# Patient Record
Sex: Male | Born: 1955 | Race: White | Hispanic: No | Marital: Married | Smoking: Never smoker
Health system: Southern US, Community
[De-identification: ages and names within clinical notes are randomized; demographics above are authoritative.]

## PROBLEM LIST (undated history)

## (undated) DIAGNOSIS — K7030 Alcoholic cirrhosis of liver without ascites: Secondary | ICD-10-CM

## (undated) DIAGNOSIS — I8500 Esophageal varices without bleeding: Secondary | ICD-10-CM

## (undated) DIAGNOSIS — L03115 Cellulitis of right lower limb: Secondary | ICD-10-CM

## (undated) DIAGNOSIS — G4700 Insomnia, unspecified: Secondary | ICD-10-CM

## (undated) DIAGNOSIS — J449 Chronic obstructive pulmonary disease, unspecified: Secondary | ICD-10-CM

## (undated) DIAGNOSIS — K7290 Hepatic failure, unspecified without coma: Secondary | ICD-10-CM

## (undated) DIAGNOSIS — R601 Generalized edema: Secondary | ICD-10-CM

## (undated) DIAGNOSIS — M7531 Calcific tendinitis of right shoulder: Secondary | ICD-10-CM

## (undated) DIAGNOSIS — E559 Vitamin D deficiency, unspecified: Secondary | ICD-10-CM

## (undated) DIAGNOSIS — R52 Pain, unspecified: Secondary | ICD-10-CM

## (undated) DIAGNOSIS — I10 Essential (primary) hypertension: Secondary | ICD-10-CM

## (undated) DIAGNOSIS — I890 Lymphedema, not elsewhere classified: Secondary | ICD-10-CM

## (undated) DIAGNOSIS — F329 Major depressive disorder, single episode, unspecified: Secondary | ICD-10-CM

## (undated) DIAGNOSIS — K219 Gastro-esophageal reflux disease without esophagitis: Secondary | ICD-10-CM

## (undated) DIAGNOSIS — B399 Histoplasmosis, unspecified: Secondary | ICD-10-CM

## (undated) DIAGNOSIS — E669 Obesity, unspecified: Secondary | ICD-10-CM

## (undated) HISTORY — PX: TIPS PROCEDURE: SHX808

## (undated) HISTORY — PX: CHOLECYSTECTOMY: SHX55

---

## 2015-04-13 DIAGNOSIS — R601 Generalized edema: Secondary | ICD-10-CM

## 2015-04-13 DIAGNOSIS — G4700 Insomnia, unspecified: Secondary | ICD-10-CM

## 2015-04-13 DIAGNOSIS — K219 Gastro-esophageal reflux disease without esophagitis: Secondary | ICD-10-CM

## 2015-04-13 HISTORY — DX: Generalized edema: R60.1

## 2015-04-13 HISTORY — DX: Gastro-esophageal reflux disease without esophagitis: K21.9

## 2015-04-13 HISTORY — DX: Insomnia, unspecified: G47.00

## 2015-04-26 ENCOUNTER — Other Ambulatory Visit
Admission: RE | Admit: 2015-04-26 | Discharge: 2015-04-26 | Disposition: A | Discharge status: Home / Self Care | Source: Ambulatory Visit | Attending: Family Medicine | Admitting: Family Medicine

## 2015-04-26 DIAGNOSIS — K7031 Alcoholic cirrhosis of liver with ascites: Secondary | ICD-10-CM | POA: Diagnosis not present

## 2015-04-26 DIAGNOSIS — L03115 Cellulitis of right lower limb: Secondary | ICD-10-CM | POA: Insufficient documentation

## 2015-04-26 DIAGNOSIS — J449 Chronic obstructive pulmonary disease, unspecified: Secondary | ICD-10-CM | POA: Insufficient documentation

## 2015-04-26 DIAGNOSIS — I8500 Esophageal varices without bleeding: Secondary | ICD-10-CM | POA: Diagnosis not present

## 2015-04-26 LAB — URINALYSIS COMPLETE WITH MICROSCOPIC (ARMC ONLY)
Bilirubin Urine: NEGATIVE
Glucose, UA: NEGATIVE mg/dL
Ketones, ur: NEGATIVE mg/dL
Nitrite: POSITIVE — AB
Protein, ur: NEGATIVE mg/dL
Specific Gravity, Urine: 1.008 (ref 1.005–1.030)
pH: 7 (ref 5.0–8.0)

## 2015-04-29 LAB — URINE CULTURE

## 2015-05-04 ENCOUNTER — Encounter: Payer: Self-pay | Admitting: Emergency Medicine

## 2015-05-04 ENCOUNTER — Emergency Department
Admission: EM | Admit: 2015-05-04 | Discharge: 2015-05-04 | Disposition: A | Discharge status: Home / Self Care | Attending: Emergency Medicine | Admitting: Emergency Medicine

## 2015-05-04 DIAGNOSIS — K7030 Alcoholic cirrhosis of liver without ascites: Secondary | ICD-10-CM | POA: Insufficient documentation

## 2015-05-04 DIAGNOSIS — R040 Epistaxis: Secondary | ICD-10-CM | POA: Diagnosis not present

## 2015-05-04 HISTORY — DX: Insomnia, unspecified: G47.00

## 2015-05-04 HISTORY — DX: Hepatic failure, unspecified without coma: K72.90

## 2015-05-04 HISTORY — DX: Hypomagnesemia: E83.42

## 2015-05-04 HISTORY — DX: Cellulitis of right lower limb: L03.115

## 2015-05-04 HISTORY — DX: Chronic obstructive pulmonary disease, unspecified: J44.9

## 2015-05-04 HISTORY — DX: Major depressive disorder, single episode, unspecified: F32.9

## 2015-05-04 HISTORY — DX: Essential (primary) hypertension: I10

## 2015-05-04 HISTORY — DX: Alcoholic cirrhosis of liver without ascites: K70.30

## 2015-05-04 HISTORY — DX: Pain, unspecified: R52

## 2015-05-04 HISTORY — DX: Gastro-esophageal reflux disease without esophagitis: K21.9

## 2015-05-04 HISTORY — DX: Generalized edema: R60.1

## 2015-05-04 HISTORY — DX: Vitamin D deficiency, unspecified: E55.9

## 2015-05-04 HISTORY — DX: Lymphedema, not elsewhere classified: I89.0

## 2015-05-04 HISTORY — DX: Calcific tendinitis of right shoulder: M75.31

## 2015-05-04 HISTORY — DX: Esophageal varices without bleeding: I85.00

## 2015-05-04 HISTORY — DX: Obesity, unspecified: E66.9

## 2015-05-04 MED ORDER — SILVER NITRATE-POT NITRATE 75-25 % EX MISC
CUTANEOUS | Status: AC
  Administered 2015-05-04: 1 via TOPICAL
  Filled 2015-05-04: qty 1

## 2015-05-04 MED ORDER — SILVER NITRATE-POT NITRATE 75-25 % EX MISC
1.0000 | Freq: Once | CUTANEOUS | Status: AC
  Administered 2015-05-04: 1 via TOPICAL

## 2015-05-04 NOTE — ED Notes (Addendum)
Pt [facility] ED [facility] c/o nose bleed x 2 today. Per EMS pt has history of nose bleeds, is not on blood thinners. Bleeding under control, dried blood noticed to lips. Pt reports pain in right leg, pt states he has had this pain "for months." Denies chest pain, abdominal pain, dizziness, lightheadedness, or shortness of breath. Pt alert and oriented x 4, skin warm and dry.

## 2015-05-04 NOTE — ED Provider Notes (Signed)
[facility] Emergency Department Provider Note  ____________________________________________  Time seen: Approximately 9:55 PM  I have reviewed the triage vital signs and the nursing notes.   HISTORY  Chief Complaint Epistaxis  Chief complaint is nosebleed  HPI Kristopher SanfilippoDennis Casey is a 59 y.o. male patient reports nosebleed from the right side of his nose twice today. He reports he's had this intermittently for years. EMS reports there is been no bleeding for the last half hour. Patient reports he is not lightheaded and feels normal does not have any other complaints. Patient reports he has blood drawn almost every day   No past medical history on file.  Medical history alcoholic cirrhosis with ascites and esophageal varices without bleeding COPD lymphedema of repeated nosebleeds insomnia of magnesium via major depressive disorder 1 episode  There are no active problems to display for this patient.   No past surgical history on file.  No current outpatient prescriptions on file.  Allergies Review of patient's allergies indicates not on file.  No family history on file.  Social History Social History  Substance Use Topics  . Smoking status: Not on file  . Smokeless tobacco: Not on file  . Alcohol Use: Not on file   patient is a nursing home resident  Review of Systems Constitutional: No fever/chills Eyes: No visual changes. ENT: No sore throat. Cardiovascular: Denies chest pain. Respiratory: Denies shortness of breath. Gastrointestinal: No abdominal pain.  No nausea, no vomiting.  No diarrhea.  No constipation. Genitourinary: Negative for dysuria. Musculoskeletal: Negative for back pain. Skin: Negative for rash. Neurological: Negative for headaches, focal weakness or numbness.  10-point ROS otherwise negative.  ____________________________________________   PHYSICAL EXAM:  VITAL SIGNS: ED Triage Vitals  Enc Vitals Group     BP --       Pulse --      Resp --      Temp --      Temp src --      SpO2 --      Weight --      Height --      Head Cir --      Peak Flow --      Pain Score --      Pain Loc --      Pain Edu? --      Excl. in GC? --     Constitutional: Alert and oriented. Well appearing and in no acute distress. Eyes: Conjunctivae are possibly slightly icteric. PERRL. EOMI. Head: Atraumatic. Nose: No congestion/rhinnorhea. There were 2 small pinpoints of what look like recent bleeding in the right side of the nares and Kessel box plexus area Mouth/Throat: Mucous membranes are moist.  Oropharynx non-erythematous. Neck: No stridor.  Cardiovascular: Normal rate, regular rhythm. Grossly normal heart sounds.  Good peripheral circulation. Respiratory: Normal respiratory effort.  No retractions. Lungs CTAB. Gastrointestinal: Soft and nontender. Chronic distention. No abdominal bruits. No CVA tenderness. Neurologic:  Normal speech and language. No gross focal neurologic deficits are appreciated. No gait instability.   ____________________________________________   LABS (all labs ordered are listed, but only abnormal results are displayed)  Labs Reviewed - No data to display ____________________________________________  EKG   ____________________________________________  RADIOLOGY   ____________________________________________   PROCEDURES  Bleeding points in the right side of the nasal septum were cauterized with silver nitrate patient tolerated this very well  _Blood pressure and pulse per nursing notes. Blood pressure was 101 systolic. Heart rate was 97. Again patient feels well  does not feel lightheaded is not pale ___________________________________________   INITIAL IMPRESSION / ASSESSMENT AND PLAN / ED COURSE  Pertinent labs & imaging results that were available during my care of the patient were reviewed by me and considered in my medical decision making (see chart for  details).   ____________________________________________   FINAL CLINICAL IMPRESSION(S) / ED DIAGNOSES  Final diagnoses:  Nosebleed      Kristopher Natal, MD 05/04/15 2205

## 2015-05-04 NOTE — Discharge Instructions (Signed)
Nosebleed Nosebleeds are common. They are due to a crack in the inside lining of your nose (mucous membrane) or from a small blood vessel that starts to bleed. Nosebleeds can be caused by many conditions, such as injury, infections, dry mucous membranes or dry climate, medicines, nose picking, and home heating and cooling systems. Most nosebleeds come from blood vessels in the front of your nose. HOME CARE INSTRUCTIONS   Try controlling your nosebleed by pinching your nostrils gently and continuously for at least 10 minutes.  Avoid blowing or sniffing your nose for a number of hours after having a nosebleed.  Do not put gauze inside your nose yourself. If your nose was packed by your health care provider, try to maintain the pack inside of your nose until your health care provider removes it.  If a gauze pack was used and it starts to fall out, gently replace it or cut off the end of it.  If a balloon catheter was used to pack your nose, do not cut or remove it unless your health care provider has instructed you to do that.  Avoid lying down while you are having a nosebleed. Sit up and lean forward.  Use a nasal spray decongestant to help with a nosebleed as directed by your health care provider.  Do not use petroleum jelly or mineral oil in your nose. These can drip into your lungs.  Maintain humidity in your home by using less air conditioning or by using a humidifier.  Aspirinand blood thinners make bleeding more likely. If you are prescribed these medicines and you suffer from nosebleeds, ask your health care provider if you should stop taking the medicines or adjust the dose. Do not stop medicines unless directed by your health care provider  Resume your normal activities as you are able, but avoid straining, lifting, or bending at the waist for several days.  If your nosebleed was caused by dry mucous membranes, use over-the-counter saline nasal spray or gel. This will keep the  mucous membranes moist and allow them to heal. If you must use a lubricant, choose the water-soluble variety. Use it only sparingly, and do not use it within several hours of lying down.  Keep all follow-up visits as directed by your health care provider. This is important. SEEK MEDICAL CARE IF:  You have a fever.  You get frequent nosebleeds.  You are getting nosebleeds more often. SEEK IMMEDIATE MEDICAL CARE IF:  Your nosebleed lasts longer than 20 minutes.  Your nosebleed occurs after an injury to your face, and your nose looks crooked or broken.  You have unusual bleeding from other parts of your body.  You have unusual bruising on other parts of your body.  You feel light-headed or you faint.  You become sweaty.  You vomit blood.  Your nosebleed occurs after a head injury.   This information is not intended to replace advice given to you by your health care provider. Make sure you discuss any questions you have with your health care provider.   Document Released: 03/09/2005 Document Revised: 06/20/2014 Document Reviewed: 01/13/2014 Elsevier Interactive Patient Education [employer]vier Inc.  Please return for further nosebleeds as needed follow-up with Dr. Reginia Casey also. Be sure your doctor continues to monitor your liver status return as needed for that as well

## 2015-05-08 ENCOUNTER — Other Ambulatory Visit
Admission: RE | Admit: 2015-05-08 | Discharge: 2015-05-08 | Disposition: A | Discharge status: Home / Self Care | Source: Ambulatory Visit | Attending: Family Medicine | Admitting: Family Medicine

## 2015-05-08 DIAGNOSIS — I8500 Esophageal varices without bleeding: Secondary | ICD-10-CM | POA: Diagnosis present

## 2015-05-08 DIAGNOSIS — K7031 Alcoholic cirrhosis of liver with ascites: Secondary | ICD-10-CM | POA: Insufficient documentation

## 2015-05-08 DIAGNOSIS — J449 Chronic obstructive pulmonary disease, unspecified: Secondary | ICD-10-CM | POA: Diagnosis present

## 2015-05-08 DIAGNOSIS — L03115 Cellulitis of right lower limb: Secondary | ICD-10-CM | POA: Diagnosis not present

## 2015-05-08 LAB — GENTAMICIN LEVEL, TROUGH: GENTAMICIN TR: 1.9 ug/mL (ref 0.5–2.0)

## 2015-05-08 LAB — GENTAMICIN LEVEL, PEAK: GENTAMICIN PK: 2.9 ug/mL — AB (ref 5.0–10.0)

## 2015-05-10 ENCOUNTER — Other Ambulatory Visit
Admission: RE | Admit: 2015-05-10 | Discharge: 2015-05-10 | Disposition: A | Discharge status: Home / Self Care | Source: Ambulatory Visit | Attending: Family Medicine | Admitting: Family Medicine

## 2015-05-10 DIAGNOSIS — L03115 Cellulitis of right lower limb: Secondary | ICD-10-CM | POA: Insufficient documentation

## 2015-05-10 DIAGNOSIS — J449 Chronic obstructive pulmonary disease, unspecified: Secondary | ICD-10-CM | POA: Insufficient documentation

## 2015-05-10 DIAGNOSIS — I8500 Esophageal varices without bleeding: Secondary | ICD-10-CM | POA: Diagnosis present

## 2015-05-10 DIAGNOSIS — K7031 Alcoholic cirrhosis of liver with ascites: Secondary | ICD-10-CM | POA: Diagnosis not present

## 2015-05-10 LAB — CREATININE, SERUM
Creatinine, Ser: 1.1 mg/dL (ref 0.61–1.24)
GFR calc non Af Amer: >60 mL/min (ref >60)

## 2015-05-10 LAB — GENTAMICIN LEVEL, TROUGH: Gentamicin Trough: 1.2 ug/mL (ref 0.5–2.0)

## 2015-05-10 LAB — BASIC METABOLIC PANEL
ANION GAP: 8 (ref 5–15)
BUN: 22 mg/dL — AB (ref 6–20)
CO2: 29 mmol/L (ref 22–32)
Calcium: 8.4 mg/dL — ABNORMAL LOW (ref 8.9–10.3)
Chloride: 90 mmol/L — ABNORMAL LOW (ref 101–111)
Creatinine, Ser: 1.29 mg/dL — ABNORMAL HIGH (ref 0.61–1.24)
GFR, EST NON AFRICAN AMERICAN: 60 mL/min — AB (ref >60)
Glucose, Bld: 126 mg/dL — ABNORMAL HIGH (ref 65–99)
POTASSIUM: 4.3 mmol/L (ref 3.5–5.1)
SODIUM: 127 mmol/L — AB (ref 135–145)

## 2015-05-11 ENCOUNTER — Inpatient Hospital Stay
Admission: EM | Admit: 2015-05-11 | Discharge: 2015-05-12 | DRG: 433 | Disposition: A | Discharge status: Skilled Nursing Facility | Attending: Internal Medicine | Admitting: Internal Medicine

## 2015-05-11 ENCOUNTER — Other Ambulatory Visit: Payer: Self-pay

## 2015-05-11 ENCOUNTER — Emergency Department

## 2015-05-11 ENCOUNTER — Other Ambulatory Visit
Admission: RE | Admit: 2015-05-11 | Discharge: 2015-05-11 | Disposition: A | Discharge status: Home / Self Care | Source: Ambulatory Visit | Attending: Physician Assistant | Admitting: Physician Assistant

## 2015-05-11 ENCOUNTER — Encounter: Payer: Self-pay | Admitting: Emergency Medicine

## 2015-05-11 DIAGNOSIS — F10188 Alcohol abuse with other alcohol-induced disorder: Secondary | ICD-10-CM | POA: Diagnosis not present

## 2015-05-11 DIAGNOSIS — K7031 Alcoholic cirrhosis of liver with ascites: Principal | ICD-10-CM | POA: Diagnosis present

## 2015-05-11 DIAGNOSIS — I10 Essential (primary) hypertension: Secondary | ICD-10-CM | POA: Diagnosis present

## 2015-05-11 DIAGNOSIS — N390 Urinary tract infection, site not specified: Secondary | ICD-10-CM | POA: Insufficient documentation

## 2015-05-11 DIAGNOSIS — R609 Edema, unspecified: Secondary | ICD-10-CM

## 2015-05-11 DIAGNOSIS — Z6841 Body Mass Index (BMI) 40.0 and over, adult: Secondary | ICD-10-CM | POA: Diagnosis not present

## 2015-05-11 DIAGNOSIS — E669 Obesity, unspecified: Secondary | ICD-10-CM | POA: Diagnosis not present

## 2015-05-11 DIAGNOSIS — D696 Thrombocytopenia, unspecified: Secondary | ICD-10-CM | POA: Diagnosis present

## 2015-05-11 DIAGNOSIS — J449 Chronic obstructive pulmonary disease, unspecified: Secondary | ICD-10-CM | POA: Diagnosis present

## 2015-05-11 DIAGNOSIS — E559 Vitamin D deficiency, unspecified: Secondary | ICD-10-CM | POA: Diagnosis present

## 2015-05-11 DIAGNOSIS — E871 Hypo-osmolality and hyponatremia: Secondary | ICD-10-CM

## 2015-05-11 DIAGNOSIS — D649 Anemia, unspecified: Secondary | ICD-10-CM | POA: Diagnosis not present

## 2015-05-11 DIAGNOSIS — K219 Gastro-esophageal reflux disease without esophagitis: Secondary | ICD-10-CM | POA: Diagnosis present

## 2015-05-11 DIAGNOSIS — F329 Major depressive disorder, single episode, unspecified: Secondary | ICD-10-CM | POA: Diagnosis not present

## 2015-05-11 DIAGNOSIS — K7290 Hepatic failure, unspecified without coma: Secondary | ICD-10-CM | POA: Diagnosis not present

## 2015-05-11 DIAGNOSIS — R188 Other ascites: Secondary | ICD-10-CM | POA: Diagnosis present

## 2015-05-11 DIAGNOSIS — Z883 Allergy status to other anti-infective agents status: Secondary | ICD-10-CM | POA: Diagnosis not present

## 2015-05-11 DIAGNOSIS — K7030 Alcoholic cirrhosis of liver without ascites: Secondary | ICD-10-CM | POA: Diagnosis present

## 2015-05-11 DIAGNOSIS — Z9049 Acquired absence of other specified parts of digestive tract: Secondary | ICD-10-CM | POA: Diagnosis not present

## 2015-05-11 HISTORY — DX: Histoplasmosis, unspecified: B39.9

## 2015-05-11 LAB — CBC WITH DIFFERENTIAL/PLATELET
Basophils Absolute: 0 10*3/uL (ref 0–0.1)
EOS ABS: 0.1 10*3/uL (ref 0–0.7)
Eosinophils Relative: 1 %
HCT: 20.9 % — ABNORMAL LOW (ref 40.0–52.0)
HEMOGLOBIN: 7.3 g/dL — AB (ref 13.0–18.0)
LYMPHS ABS: 0.3 10*3/uL — AB (ref 1.0–3.6)
Lymphocytes Relative: 4 %
MCH: 30.8 pg (ref 26.0–34.0)
MCHC: 34.9 g/dL (ref 32.0–36.0)
MCV: 88.2 fL (ref 80.0–100.0)
MONO ABS: 0.8 10*3/uL (ref 0.2–1.0)
Neutro Abs: 4.8 10*3/uL (ref 1.4–6.5)
Platelets: 49 10*3/uL — ABNORMAL LOW (ref 150–440)
RBC: 2.37 MIL/uL — ABNORMAL LOW (ref 4.40–5.90)
RDW: 18.3 % — AB (ref 11.5–14.5)
WBC: 5.9 10*3/uL (ref 3.8–10.6)

## 2015-05-11 LAB — GENTAMICIN LEVEL, PEAK: GENTAMICIN PK: 3.9 ug/mL — AB (ref 5.0–10.0)

## 2015-05-11 LAB — COMPREHENSIVE METABOLIC PANEL
ALBUMIN: 2.3 g/dL — AB (ref 3.5–5.0)
ALK PHOS: 114 U/L (ref 38–126)
ALT: 24 U/L (ref 17–63)
AST: 43 U/L — AB (ref 15–41)
Anion gap: 6 (ref 5–15)
BILIRUBIN TOTAL: 3.9 mg/dL — AB (ref 0.3–1.2)
BUN: 25 mg/dL — AB (ref 6–20)
CALCIUM: 8.1 mg/dL — AB (ref 8.9–10.3)
CO2: 31 mmol/L (ref 22–32)
CREATININE: 1.33 mg/dL — AB (ref 0.61–1.24)
Chloride: 89 mmol/L — ABNORMAL LOW (ref 101–111)
GFR calc Af Amer: >60 mL/min (ref >60)
GFR, EST NON AFRICAN AMERICAN: 57 mL/min — AB (ref >60)
GLUCOSE: 185 mg/dL — AB (ref 65–99)
Potassium: 4.5 mmol/L (ref 3.5–5.1)
Sodium: 126 mmol/L — ABNORMAL LOW (ref 135–145)
TOTAL PROTEIN: 5.1 g/dL — AB (ref 6.5–8.1)

## 2015-05-11 LAB — APTT: APTT: 43 s — AB (ref 24–36)

## 2015-05-11 LAB — AMMONIA: AMMONIA: 89 umol/L — AB (ref 9–35)

## 2015-05-11 LAB — GENTAMICIN LEVEL, TROUGH: Gentamicin Trough: 0.7 ug/mL (ref 0.5–2.0)

## 2015-05-11 LAB — TROPONIN I: Troponin I: <0.03 ng/mL (ref <0.031)

## 2015-05-11 LAB — PROTIME-INR
INR: 1.82
Prothrombin Time: 21 seconds — ABNORMAL HIGH (ref 11.4–15.0)

## 2015-05-11 LAB — LIPASE, BLOOD: LIPASE: 37 U/L (ref 11–51)

## 2015-05-11 LAB — LACTIC ACID, PLASMA: Lactic Acid, Venous: 2 mmol/L (ref 0.5–2.0)

## 2015-05-11 MED ORDER — SODIUM CHLORIDE 0.9 % IV SOLN: Freq: Once | INTRAVENOUS | Status: DC

## 2015-05-11 NOTE — ED Notes (Signed)
[facility] with reports of abnormal lab results, hemoglobin 6.8. Pt has hx of paracentesis and had 10lbs of fluid drawn off x1 week.

## 2015-05-11 NOTE — ED Notes (Signed)
[facility] to report patient having a 10lb weight gain since last paracentesis that occurred x 2 weeks ago. Nurse states, "We are wanting him to have another one before he comes back". Information to be relayed to EDP.

## 2015-05-11 NOTE — ED Provider Notes (Signed)
-----------------------------------------   10:59 PM on 05/11/2015 -----------------------------------------  I greeted the patient in the exam room when he arrived by EMS.  [facility] gradually decreasing hemoglobin, reportedly down below 7 today.  He has a history of liver failure and reportedly recently had a large volume paracentesis, though his ascites has reaccumulated.  It is unclear at this time where he receives most of his care - I see [facility] computer [facility].  The patient appears ill, though he has the appearance of chronic illness secondary to liver failure.  His BP is low but stable and his vitals are otherwise unremarkable.  I have ordered a broad lab workup including sepsis labs, cultures, and ammonia, as well as a T&S.  I also verbally and electronically consented the patient for blood products with my usual and customary risks/benefits discussion, and I placed the order for 1 unit of PRBCs to be crossmatched and transfused, given the patient's lab reports brought by EMS and his obvious pallor.  Dr. Manson PasseyBrown is arriving for the 11:00pm shift and will manage the patient primarily.   Loleta Roseory Halayna Blane, MD 05/12/15 [phone]

## 2015-05-12 ENCOUNTER — Encounter: Payer: Self-pay | Admitting: Internal Medicine

## 2015-05-12 ENCOUNTER — Inpatient Hospital Stay

## 2015-05-12 ENCOUNTER — Other Ambulatory Visit: Payer: Self-pay | Admitting: Emergency Medicine

## 2015-05-12 DIAGNOSIS — E669 Obesity, unspecified: Secondary | ICD-10-CM | POA: Diagnosis present

## 2015-05-12 DIAGNOSIS — J449 Chronic obstructive pulmonary disease, unspecified: Secondary | ICD-10-CM | POA: Diagnosis present

## 2015-05-12 DIAGNOSIS — D696 Thrombocytopenia, unspecified: Secondary | ICD-10-CM | POA: Diagnosis present

## 2015-05-12 DIAGNOSIS — E559 Vitamin D deficiency, unspecified: Secondary | ICD-10-CM | POA: Diagnosis present

## 2015-05-12 DIAGNOSIS — Z9049 Acquired absence of other specified parts of digestive tract: Secondary | ICD-10-CM | POA: Diagnosis not present

## 2015-05-12 DIAGNOSIS — E871 Hypo-osmolality and hyponatremia: Secondary | ICD-10-CM | POA: Diagnosis present

## 2015-05-12 DIAGNOSIS — D649 Anemia, unspecified: Secondary | ICD-10-CM | POA: Diagnosis present

## 2015-05-12 DIAGNOSIS — K219 Gastro-esophageal reflux disease without esophagitis: Secondary | ICD-10-CM | POA: Diagnosis present

## 2015-05-12 DIAGNOSIS — Z6841 Body Mass Index (BMI) 40.0 and over, adult: Secondary | ICD-10-CM | POA: Diagnosis not present

## 2015-05-12 DIAGNOSIS — K7031 Alcoholic cirrhosis of liver with ascites: Secondary | ICD-10-CM | POA: Diagnosis present

## 2015-05-12 DIAGNOSIS — R188 Other ascites: Secondary | ICD-10-CM | POA: Diagnosis present

## 2015-05-12 DIAGNOSIS — F10188 Alcohol abuse with other alcohol-induced disorder: Secondary | ICD-10-CM | POA: Diagnosis present

## 2015-05-12 DIAGNOSIS — F329 Major depressive disorder, single episode, unspecified: Secondary | ICD-10-CM | POA: Diagnosis present

## 2015-05-12 DIAGNOSIS — Z883 Allergy status to other anti-infective agents status: Secondary | ICD-10-CM | POA: Diagnosis not present

## 2015-05-12 DIAGNOSIS — K7290 Hepatic failure, unspecified without coma: Secondary | ICD-10-CM | POA: Diagnosis present

## 2015-05-12 DIAGNOSIS — I10 Essential (primary) hypertension: Secondary | ICD-10-CM | POA: Diagnosis present

## 2015-05-12 LAB — BASIC METABOLIC PANEL
Anion gap: 7 (ref 5–15)
BUN: 24 mg/dL — ABNORMAL HIGH (ref 6–20)
CALCIUM: 8.1 mg/dL — AB (ref 8.9–10.3)
CO2: 32 mmol/L (ref 22–32)
CREATININE: 1.25 mg/dL — AB (ref 0.61–1.24)
Chloride: 88 mmol/L — ABNORMAL LOW (ref 101–111)
Glucose, Bld: 124 mg/dL — ABNORMAL HIGH (ref 65–99)
Potassium: 4 mmol/L (ref 3.5–5.1)
SODIUM: 127 mmol/L — AB (ref 135–145)

## 2015-05-12 LAB — CBC
HEMATOCRIT: 21.2 % — AB (ref 40.0–52.0)
Hemoglobin: 7.6 g/dL — ABNORMAL LOW (ref 13.0–18.0)
MCH: 31.5 pg (ref 26.0–34.0)
MCHC: 35.9 g/dL (ref 32.0–36.0)
MCV: 87.6 fL (ref 80.0–100.0)
Platelets: 44 10*3/uL — ABNORMAL LOW (ref 150–440)
RBC: 2.42 MIL/uL — AB (ref 4.40–5.90)
RDW: 18.1 % — ABNORMAL HIGH (ref 11.5–14.5)
WBC: 5.1 10*3/uL (ref 3.8–10.6)

## 2015-05-12 LAB — PREPARE RBC (CROSSMATCH)

## 2015-05-12 MED ORDER — MORPHINE SULFATE (PF) 2 MG/ML IV SOLN: 2.0000 mg | INTRAVENOUS | Status: DC | PRN

## 2015-05-12 MED ORDER — OMEPRAZOLE 20 MG PO CPDR
40.0000 mg | DELAYED_RELEASE_CAPSULE | Freq: Every day | ORAL | Status: DC
  Filled 2015-05-12 (×2): qty 2

## 2015-05-12 MED ORDER — NON FORMULARY: Freq: Every day | Status: DC

## 2015-05-12 MED ORDER — MORPHINE SULFATE (PF) 2 MG/ML IV SOLN
INTRAVENOUS | Status: AC
  Administered 2015-05-12: 2 mg via INTRAVENOUS
  Filled 2015-05-12: qty 1

## 2015-05-12 MED ORDER — LACTULOSE 10 GM/15ML PO SOLN
ORAL | Status: AC
  Administered 2015-05-12: 20 g via ORAL
  Filled 2015-05-12: qty 30

## 2015-05-12 MED ORDER — LACTULOSE 10 GM/15ML PO SOLN
20.0000 g | Freq: Three times a day (TID) | ORAL | Status: DC
  Administered 2015-05-12: 20 g via ORAL

## 2015-05-12 MED ORDER — MORPHINE SULFATE (PF) 2 MG/ML IV SOLN
2.0000 mg | Freq: Once | INTRAVENOUS | Status: AC
  Administered 2015-05-12: 2 mg via INTRAVENOUS

## 2015-05-12 MED ORDER — IPRATROPIUM-ALBUTEROL 0.5-2.5 (3) MG/3ML IN SOLN
3.0000 mL | RESPIRATORY_TRACT | Status: DC | PRN
  Administered 2015-05-12: 3 mL via RESPIRATORY_TRACT
  Filled 2015-05-12: qty 3

## 2015-05-12 MED ORDER — SODIUM CHLORIDE 0.9 % IJ SOLN
3.0000 mL | Freq: Two times a day (BID) | INTRAMUSCULAR | Status: DC
  Administered 2015-05-12: 3 mL via INTRAVENOUS

## 2015-05-12 NOTE — Progress Notes (Signed)
Update:  Called by nursing that patient's wife stated to her that [facility], and have this scheduled and prefer to have the procedure done there if it is not emergent.  Explained that admission for observation for this patient was to do paracentesis at patient's request.  Will not d/c patient during night shift, this decision will need to be clarified in AM with day shift.  Kristeen MissWILLIS, Cruise Baumgardner FIELDING [facility] 05/12/2015, 2:37 AM

## 2015-05-12 NOTE — ED Notes (Signed)
RN back to ED - spoke with Anne HahnWillis, MD to make him aware of patient's c/o pain. MD reminded that APAP had been ordered, however patient is in liver failure. MD to discontinue APAP and place additional Morphine orders; aware that patient was given Morphine 2mg  IVP. This RN returned call to primary RN on 2C (Lauren) to make her aware of changes that were being made to the orders. Nurse was made aware at time of transport that patient was given Morphine 2mg  IVP prior to leaving ED.

## 2015-05-12 NOTE — H&P (Signed)
[facility] [facility]   PATIENT NAME: Kristopher Casey    MR#:  [id number]  DATE OF BIRTH:  July 23, 1955  DATE OF ADMISSION:  05/11/2015  PRIMARY CARE PHYSICIAN: Keane PoliceSLADE-HARTMAN, VENEZELA, MD   REQUESTING/REFERRING PHYSICIAN: Manson PasseyBrown, M.D.  CHIEF COMPLAINT:   Chief Complaint  Patient presents with  . Abnormal Lab    HISTORY OF PRESENT ILLNESS:  Kristopher Casey  is a 59 y.o. male who presents from nursing facility due to hemoglobin of 6.8. Patient has alcoholic cirrhosis and significant ascites. States that over the last couple weeks since his last paracentesis he has gained about 10 pounds and has significant abdominal distention due to large volume ascites. Patient requests paracentesis. Hospitals were called for admission for his ascites and anemia requiring transfusion.  PAST MEDICAL HISTORY:   Past Medical History  Diagnosis Date  . Alcoholic cirrhosis of liver (HCC)     with ascites  . Esophageal varices without bleeding (HCC)   . COPD (chronic obstructive pulmonary disease) (HCC)   . Cellulitis of right lower extremity   . Lymphedema   . Calcific tendinitis of right shoulder   . Hypertension   . GERD without esophagitis 04/13/15  . Insomnia 04/13/15  . Generalized edema 04/13/15  . Hepatic failure, unspecified without coma (HCC)   . Hypomagnesemia   . Vitamin D deficiency   . Pain     unspecified  . Major depressive disorder (HCC)   . Obesity   . Histoplasmosis     per spouse report    PAST SURGICAL HISTORY:   Past Surgical History  Procedure Laterality Date  . Cholecystectomy    . Tips procedure      SOCIAL HISTORY:   Social History  Substance Use Topics  . Smoking status: Never Smoker   . Smokeless tobacco: Never Used  . Alcohol Use: No    FAMILY HISTORY:  History reviewed. No pertinent family history.  DRUG ALLERGIES:   Allergies  Allergen Reactions  . Levaquin [Levofloxacin In D5w]   . Protonix [Pantoprazole  Sodium]     MEDICATIONS AT HOME:   Prior to Admission medications   Medication Sig Start Date End Date Taking? Authorizing Provider  amLODipine (NORVASC) 5 MG tablet Take 5 mg by mouth daily.   Yes Historical Provider, MD  lactulose (CEPHULAC) 20 G packet Take 20 g by mouth 4 (four) times daily.   Yes Historical Provider, MD  omeprazole (PRILOSEC) 40 MG capsule Take 40 mg by mouth daily.   Yes Historical Provider, MD  spironolactone (ALDACTONE) 100 MG tablet Take 100 mg by mouth daily.   Yes Historical Provider, MD    REVIEW OF SYSTEMS:  Review of Systems  Constitutional: Negative for fever, chills, weight loss and malaise/fatigue.  HENT: Negative for ear pain, hearing loss and tinnitus.   Eyes: Negative for blurred vision, double vision, pain and redness.  Respiratory: Positive for wheezing. Negative for cough, hemoptysis and shortness of breath.   Cardiovascular: Negative for chest pain, palpitations, orthopnea and leg swelling.  Gastrointestinal: Positive for abdominal pain. Negative for nausea, vomiting, diarrhea and constipation.  Genitourinary: Negative for dysuria, frequency and hematuria.  Musculoskeletal: Negative for back pain, joint pain and neck pain.  Skin:       No acne, rash, or lesions  Neurological: Negative for dizziness, tremors, focal weakness and weakness.  Endo/Heme/Allergies: Negative for polydipsia. Does not bruise/bleed easily.  Psychiatric/Behavioral: Negative for depression. The patient is not nervous/anxious and does not have  insomnia.      VITAL SIGNS:   Filed Vitals:   05/11/15 2249 05/11/15 2301  BP:  109/69  Pulse:  98  Temp:  98.5 F (36.9 C)  TempSrc:  Oral  Resp:  16  Height:   (1.778 m)  Weight:  169.192 kg (373 lb)  SpO2: 93% 99%   Wt Readings from Last 3 Encounters:  05/11/15 169.192 kg (373 lb)  05/04/15 163.749 kg (361 lb)    PHYSICAL EXAMINATION:  Physical Exam  Vitals reviewed. Constitutional: He is oriented to  person, place, and time. He appears well-developed and well-nourished. No distress.  HENT:  Head: Normocephalic and atraumatic.  Mouth/Throat: Oropharynx is clear and moist.  Eyes: Conjunctivae and EOM are normal. Pupils are equal, round, and reactive to light. No scleral icterus.  Neck: Normal range of motion. Neck supple. No JVD present. No thyromegaly present.  Cardiovascular: Normal rate, regular rhythm and intact distal pulses.  Exam reveals no gallop and no friction rub.   No murmur heard. Respiratory: Effort normal. No respiratory distress. He has wheezes. He has no rales.  GI: Bowel sounds are normal. He exhibits distension. There is tenderness.  Musculoskeletal: Normal range of motion. He exhibits edema.  No arthritis, no gout  Lymphadenopathy:    He has no cervical adenopathy.  Neurological: He is alert and oriented to person, place, and time. No cranial nerve deficit.  No dysarthria, no aphasia  Skin: Skin is warm and dry. No rash noted. No erythema.  Psychiatric: He has a normal mood and affect. His behavior is normal. Judgment and thought content normal.    LABORATORY PANEL:   CBC  Recent Labs Lab 05/11/15 2258  WBC 5.9  HGB 7.3*  HCT 20.9*  PLT 49*   ------------------------------------------------------------------------------------------------------------------  Chemistries   Recent Labs Lab 05/11/15 2258  NA 126*  K 4.5  CL 89*  CO2 31  GLUCOSE 185*  BUN 25*  CREATININE 1.33*  CALCIUM 8.1*  AST 43*  ALT 24  ALKPHOS 114  BILITOT 3.9*   ------------------------------------------------------------------------------------------------------------------  Cardiac Enzymes  Recent Labs Lab 05/11/15 2258  TROPONINI <0.03   ------------------------------------------------------------------------------------------------------------------  RADIOLOGY:  Dg Chest Portable 1 View  05/12/2015  CLINICAL DATA:  Acute onset of shortness of breath and  difficulty breathing. Decreased hemoglobin. Initial encounter. EXAM: PORTABLE CHEST 1 VIEW COMPARISON:  None. FINDINGS: The lungs are well-aerated. Vascular congestion is noted. Mildly increased interstitial markings could reflect minimal interstitial edema. There is no evidence of pleural effusion or pneumothorax. The cardiomediastinal silhouette is mildly enlarged. No acute osseous abnormalities are seen. IMPRESSION: Vascular congestion and mild cardiomegaly noted. Mildly increased interstitial markings could reflect minimal interstitial edema. Electronically Signed   By: Roanna Raider M.D.   On: 05/12/2015 00:22    EKG:   Orders placed or performed during the hospital encounter of 05/11/15  . EKG 12-Lead  . EKG 12-Lead    IMPRESSION AND PLAN:  Principal Problem:   Ascites - large volume. Patient had paracentesis done a few weeks ago, but has regained 10 pounds since that time. Abdomen is tense and he has fair amount of abdominal pain and the beginnings of some shortness of breath. We will admit here, and order for ultrasound paracentesis tomorrow. Active Problems:   Alcoholic cirrhosis of liver (HCC) - status post TIPS procedure, this is a cause of his significant ascites. Med reconciliation not done here, we'll need to be done as I suspect the patient is on medications for his  cirrhosis. Review through care everywhere notes significant medications in different hospitals systems throughout the state, but it is unclear the patient does not recall what medications he is on at this time. Given his hyperammonemia we will place him on lactulose.   COPD (chronic obstructive pulmonary disease) (HCC) - when necessary duo nebs, restart home meds once med reconciliation done   HTN (hypertension) - at goal here, monitor and restart meds as needed once med reconciliation done   Anemia - transfusion ordered in the ED. We'll monitor, patient denies any melena or hematemesis.   Thrombocytopenia (HCC) -  chronic, due to his liver disease. We'll monitor closely.   GERD (gastroesophageal reflux disease) - home dose PPI. Patient has listed allergy to pantoprazole, we'll order his home medication of omeprazole.  All the records are reviewed and case discussed with ED provider. Management plans discussed with the patient and/or family.  DVT PROPHYLAXIS: mechanical only due to thrombocytopenia   ADMISSION STATUS: Observation  CODE STATUS: full   TOTAL TIME TAKING CARE OF THIS PATIENT: 45 minutes.    Treylen Gibbs FIELDING 05/12/2015, 12:42 AM  [facility]  Office  [phone]  CC: Primary care physician; Keane Police, MD

## 2015-05-12 NOTE — Progress Notes (Signed)
Pt arrived on unit. No SOB, VSS.   Karsten RoLauren E Hobbs

## 2015-05-12 NOTE — Clinical Social Work Note (Signed)
CSW [facility] and [facility] are going to take patient back today afterall. MD is aware and [facility] Patient aware and will transport via EMS. York SpanielMonica Myreon Wimer MSW,LCSW [phone]

## 2015-05-12 NOTE — [place] FL2 (Signed)
  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 Ascites 05/12/2015  . COPD (chronic obstructive pulmonary disease) (HCC) 05/12/2015  . HTN (hypertension) 05/12/2015  . GERD (gastroesophageal reflux disease) 05/12/2015  . Anemia 05/12/2015  . Thrombocytopenia (HCC) 05/12/2015  . Alcoholic cirrhosis of liver (HCC)     Orientation ACTIVITIES/SOCIAL BLADDER RESPIRATION    Self, Time, Place  Active Continent O2 (As needed) (2 liters)  BEHAVIORAL SYMPTOMS/MOOD NEUROLOGICAL BOWEL NUTRITION STATUS   (none)  (none) Continent Diet  PHYSICIAN VISITS COMMUNICATION OF NEEDS Height & Weight Skin  30 days Verbally   365 lbs.            AMBULATORY STATUS RESPIRATION    Assist extensive O2 (As needed) (2 liters)      Personal Care Assistance Level of Assistance  Bathing, Dressing Bathing Assistance: Limited assistance   Dressing Assistance: Limited assistance      Functional Limitations Info                SPECIAL CARE FACTORS FREQUENCY                      Additional Factors Info                  Current Medications (05/12/2015):  This is the current hospital active medication list Current Facility-Administered Medications  Medication Dose Route Frequency Provider Last Rate Last Dose  . 0.9 %   sodium chloride infusion   Intravenous Once Loleta Roseory Forbach, MD      . ipratropium-albuterol (DUONEB) 0.5-2.5 (3) MG/3ML nebulizer solution 3 mL  3 mL Nebulization Q4H PRN Oralia Manisavid Willis, MD      . lactulose (CHRONULAC) 10 GM/15ML solution 20 g  20 g Oral TID Oralia Manisavid Willis, MD   20 g at 05/12/15 0130  . morphine 2 MG/ML injection 2 mg  2 mg Intravenous Q4H PRN Oralia Manisavid Willis, MD      . omeprazole (PRILOSEC) capsule 40 mg  40 mg Oral QAC breakfast Oralia Manisavid Willis, MD   40 mg at 05/12/15 0800  . sodium chloride 0.9 % injection 3 mL  3 mL Intravenous Q12H Oralia Manisavid Willis, MD   3 mL at 05/12/15 0254     Discharge Medications: Please see discharge summary for a list of discharge medications.  Relevant Imaging Results:  Relevant Lab Results:  Recent Labs    Additional Information    Kristopher SpanielMonica Sabrina Arriaga, LCSW

## 2015-05-12 NOTE — ED Notes (Signed)
Blood back called to report that PRBC unit ready for this patient. Spoke with EDP who advises to place this order on hold at this time. Previous EDP ordered transfusion of a single unit based off of labs received from [facility], however patient with Hgb on 7.3 here tonight.

## 2015-05-12 NOTE — ED Notes (Signed)
Spoke with Anne HahnWillis, MD regarding blood transfer. Per MD the order was continued because it was ordered before the patient was assigned to him. MD made aware that order was placed by York CeriseForbach, MD due to reports from the nursing home. RN made MD aware that he had spoke with EDP now that [facility] had resulted and per Manson PasseyBrown, MD - patient does not need to have transfusion at this time. Per Anne HahnWillis, MD - "I will defer to Dr. Manson PasseyBrown." Spoke with Dr. Manson PasseyBrown once again - patient ok to go to floor without blood at this time; [facility] to reevaluate when labs rechecked or if patient becomes symptomatic.

## 2015-05-12 NOTE — Progress Notes (Signed)
Pt's wife called the unit to talk to RN and stated, " [facility], if the paracentesis is not emergent." RN stated that pt was admitted for observation due to low Hgb and to do paracentesis due to pt request. Doc. Willis stated that case Production designer, theatre/television/filmmanager and physician will contact wife in the AM to clarify if the procedure needs to be done at the [facility] or here. RN has tried to call wife back twice and she has not answered.   Kristopher Casey

## 2015-05-12 NOTE — Discharge Summary (Signed)
[facility] [place] at [facility]   PATIENT NAME: Kristopher Casey    MR#:  [id number]  DATE OF BIRTH:  [birth year]  DATE OF ADMISSION:  05/11/2015 ADMITTING PHYSICIAN: Oralia Manisavid Willis, MD  DATE OF DISCHARGE: 05/12/2015  PRIMARY CARE PHYSICIAN: Keane PoliceSLADE-HARTMAN, VENEZELA, MD    ADMISSION DIAGNOSIS:  Hyponatremia [E87.1] Anemia, unspecified anemia type [D64.9]  DISCHARGE DIAGNOSIS:  Principal Problem:   Ascites Active Problems:   Alcoholic cirrhosis of liver (HCC)   COPD (chronic obstructive pulmonary disease) (HCC)   HTN (hypertension)   GERD (gastroesophageal reflux disease)   Anemia   Thrombocytopenia (HCC) Status post ultrasound-guided paracentesis with 8 L of fluid  SECONDARY DIAGNOSIS:   Past Medical History  Diagnosis Date  . Alcoholic cirrhosis of liver (HCC)     with ascites  . Esophageal varices without bleeding (HCC)   . COPD (chronic obstructive pulmonary disease) (HCC)   . Cellulitis of right lower extremity   . Lymphedema   . Calcific tendinitis of right shoulder   . Hypertension   . GERD without esophagitis 04/13/15  . Insomnia 04/13/15  . Generalized edema 04/13/15  . Hepatic failure, unspecified without coma (HCC)   . Hypomagnesemia   . Vitamin D deficiency   . Pain     unspecified  . Major depressive disorder (HCC)   . Obesity   . Histoplasmosis     per spouse report    HOSPITAL COURSE:  59 y.o. male who presents from nursing facility due to low hemoglobin of 6.8. Patient has alcoholic cirrhosis and significant ascites. he has gained about 10 pounds and has significant abdominal distention due to large volume ascites and was admitted for paracentesis and if need blood transfusion.  Please see Dr. Clarisa KindredWillis's dictated history and physical for further details.  Patient's repeat hemoglobin while here in the hospital, came up to 7.6, not needing blood transfusion criteria per guidelines.  Patient requested paracentesis to performed  while here in the hospital which was done under ultrasound guidance with about 8 L of fluid removed without any immediate complications.  He was feeling much better and is being discharged back to [facility] in stable condition.  He is agreeable with discharge plans. DISCHARGE CONDITIONS:   Stable  CONSULTS OBTAINED:     DRUG ALLERGIES:   Allergies  Allergen Reactions  . Levaquin [Levofloxacin In D5w]   . Protonix [Pantoprazole Sodium]     DISCHARGE MEDICATIONS:   Current Discharge Medication List    CONTINUE these medications which have NOT CHANGED   Details  acetaminophen (TYLENOL) 325 MG tablet Take 650 mg by mouth every 6 (six) hours as needed for mild pain, moderate pain, fever or headache.     amLODipine (NORVASC) 5 MG tablet Take 5 mg by mouth daily.    [employer] VITAMIN D3 1000 UNITS capsule Take 1,000 Units by mouth 2 (two) times daily.     lactulose (CEPHULAC) 20 G packet Take 20 g by mouth 4 (four) times daily.    Magnesium 400 MG TABS Take 400 mg by mouth 2 (two) times daily.    omeprazole (PRILOSEC) 40 MG capsule Take 40 mg by mouth 2 (two) times daily.     oxyCODONE (OXY IR/ROXICODONE) 5 MG immediate release tablet Take 5 mg by mouth 2 (two) times daily.    spironolactone (ALDACTONE) 100 MG tablet Take 50 mg by mouth 2 (two) times daily.     sulfamethoxazole-trimethoprim (BACTRIM DS,SEPTRA DS) 800-160 MG tablet Take 1 tablet by  mouth daily.    SYMBICORT 80-4.5 MCG/ACT inhaler Take 1 puff by mouth daily.    torsemide (DEMADEX) 100 MG tablet Take 100 mg by mouth 2 (two) times daily.    XIFAXAN 550 MG TABS tablet Take 550 mg by mouth 2 (two) times daily.         DISCHARGE INSTRUCTIONS:    DIET:  Cardiac diet  DISCHARGE CONDITION:  Good  ACTIVITY:  Activity as tolerated  OXYGEN:  Home Oxygen: No.   Oxygen Delivery: room air  DISCHARGE LOCATION:  nursing home   If you experience worsening of your admission symptoms, develop shortness of  breath, life threatening emergency, suicidal or homicidal thoughts you must seek medical attention immediately by calling 911 or calling your MD immediately  if symptoms less severe.  You Must read complete instructions/literature along with all the possible adverse reactions/side effects for all the Medicines you take and that have been prescribed to you. Take any new Medicines after you have completely understood and accpet all the possible adverse reactions/side effects.   Please note  You were cared for by a hospitalist during your hospital stay. If you have any questions about your discharge medications or the care you received while you were in the hospital after you are discharged, you can call the unit and asked to speak with the hospitalist on call if the hospitalist that took care of you is not available. Once you are discharged, your primary care physician will handle any further medical issues. Please note that NO REFILLS for any discharge medications will be authorized once you are discharged, as it is imperative that you return to your primary care physician (or establish a relationship with a primary care physician if you do not have one) for your aftercare needs so that they can reassess your need for medications and monitor your lab values.    On the day of Discharge:  VITAL SIGNS:  Blood pressure 117/56, pulse 96, temperature 97.9 F (36.6 C), temperature source Oral, resp. rate 17, height 6' (1.829 m), weight 165.744 kg (365 lb 6.4 oz), SpO2 100 %. PHYSICAL EXAMINATION:  GENERAL:  59 y.o.-year-old patient lying in the bed with no acute distress.  EYES: Pupils equal, round, reactive to light and accommodation. No scleral icterus. Extraocular muscles intact.  HEENT: Head atraumatic, normocephalic. Oropharynx and nasopharynx clear.  NECK:  Supple, no jugular venous distention. No thyroid enlargement, no tenderness.  LUNGS: Normal breath sounds bilaterally, no wheezing,  rales,rhonchi or crepitation. No use of accessory muscles of respiration.  CARDIOVASCULAR: S1, S2 normal. No murmurs, rubs, or gallops.  ABDOMEN: Soft, non-tender, non-distended. Bowel sounds present. No organomegaly or mass.  EXTREMITIES: No pedal edema, cyanosis, or clubbing.  NEUROLOGIC: Cranial nerves II through XII are intact. Muscle strength 5/5 in all extremities. Sensation intact. Gait not checked.  PSYCHIATRIC: The patient is alert and oriented x 3.  SKIN: No obvious rash, lesion, or ulcer.  DATA REVIEW:   CBC  Recent Labs Lab 05/12/15 0429  WBC 5.1  HGB 7.6*  HCT 21.2*  PLT 44*    Chemistries   Recent Labs Lab 05/11/15 2258 05/12/15 0429  NA 126* 127*  K 4.5 4.0  CL 89* 88*  CO2 31 32  GLUCOSE 185* 124*  BUN 25* 24*  CREATININE 1.33* 1.25*  CALCIUM 8.1* 8.1*  AST 43*  --   ALT 24  --   ALKPHOS 114  --   BILITOT 3.9*  --     Cardiac  Enzymes  Recent Labs Lab 05/11/15 2258  TROPONINI <0.03    Microbiology Results  Results for orders placed or performed during the hospital encounter of 04/26/15  Urine culture     Status: None   Collection Time: 04/26/15  1:00 PM  Result Value Ref Range Status   Specimen Description URINE, RANDOM  Final   Special Requests NONE  Final   Culture   Final    MULTIPLE SPECIES PRESENT, SUGGEST RECOLLECTION MULTIPLE ORGANISMS PRESENT, NONE PREDOMINANT    Report Status 04/29/2015 FINAL  Final    RADIOLOGY:  US Paracentesis  05/12/2015  CLINICAL DATA:  Ascites. EXAM: ULTRASOUND GUIDED PARACENTESIS COMPARISON:  None. PROCEDURE: An ultrasound guided paracentesis was thoroughly discussed with the patient and questions answered. The benefits, risks, alternatives and complications were also discussed. The patient understands and wishes to proceed with the procedure. Written consent was obtained. Ultrasound was performed to localize and mark an adequate pocket of fluid in the left lower quadrant of the abdomen. The area was then  prepped and draped in the normal sterile fashion. 1% Lidocaine was used for local anesthesia. Under ultrasound guidance a Safe-T-Centesis catheter was introduced. Paracentesis was performed. The catheter was removed and a dressing applied. COMPLICATIONS: None immediate. FINDINGS: A total of approximately 8 L of serous fluid was removed. A fluid sample was not sent for laboratory analysis. IMPRESSION: Successful ultrasound guided paracentesis yielding 8 L of ascites. Electronically Signed   By: Lupita Raider, M.D.   On: 05/12/2015 13:28   Dg Chest Portable 1 View  05/12/2015  CLINICAL DATA:  Acute onset of shortness of breath and difficulty breathing. Decreased hemoglobin. Initial encounter. EXAM: PORTABLE CHEST 1 VIEW COMPARISON:  None. FINDINGS: The lungs are well-aerated. Vascular congestion is noted. Mildly increased interstitial markings could reflect minimal interstitial edema. There is no evidence of pleural effusion or pneumothorax. The cardiomediastinal silhouette is mildly enlarged. No acute osseous abnormalities are seen. IMPRESSION: Vascular congestion and mild cardiomegaly noted. Mildly increased interstitial markings could reflect minimal interstitial edema. Electronically Signed   By: Roanna Raider M.D.   On: 05/12/2015 00:22     Management plans discussed with the patient, family and they are in agreement.  CODE STATUS: Full code  TOTAL TIME TAKING CARE OF THIS PATIENT: 55 minutes.    [facility], Jasneet Schobert M.D on 05/12/2015 at 3:28 PM  Between 7am to 6pm - Pager - [phone]  After 6pm go to [email or web address] - password [email or web address] [facility]  [facility]  Office  [phone]  CC: Primary care physician; Keane Police, MD   Note: This dictation was prepared with Dragon dictation along with smaller phrase technology. Any transcriptional errors that result from this process are unintentional.

## 2015-05-12 NOTE — Clinical Social Work Note (Signed)
CSW informed by MD that patient is medically ready for discharge [facility]. CS[facility] to notify them and their admission's coordinator, Stanton KidneyDebra, stated that their DON, Arline AspCindy, would need to speak with me. Arline AspCindy got on the phone with CSW and stated that they were not going to take patient back because they could not meet his needs. Arline AspCindy stated she spoke with Melissa, at the [facility], this morning and that the [facility] hospital was going to accept patient. CSW attempted to explain to Arline AspCindy that there is no indication for an acute to acute transfer at this time as the MD was ready to discharge patient back to their facility today. Arline AspCindy stated that they would not be able to take patient back regardless.   CSW left a message for the [facility], Harrold Donathathan, to return CSW call regarding concern about refusal to take patient back.  The RN CM also spoke with patient and he is refusing to transfer to the [facility].  Shortly after, CSW received a call from [place] HillsMelissa at the [facility] to inquire if [facility] and if he had an acute to acute indication for transfer. Melissa informed that patient is currently refusing to transfer to the [facility]. Melissa stated that she would call Cindy [facility] to see if they would take patient back temporarily until alternate arrangements could be made. Melissa to call CSW back. York SpanielMonica Millee Denise MSW,LCSW [phone]

## 2015-05-12 NOTE — Procedures (Signed)
Procedure and risks discussed with patient. Informed consent obtained. Under US guidance, paracentesis was performed. 

## 2015-05-12 NOTE — Discharge Instructions (Signed)
Ascites °Ascites is a collection of excess fluid in the abdomen. Ascites can range from mild to severe. It can get worse without treatment. °CAUSES °Possible causes include: °· Cirrhosis. This is the most common cause of ascites. °· Infection or inflammation in the abdomen. °· Cancer in the abdomen. °· Heart failure. °· Kidney disease. °· Inflammation of the pancreas. °· Clots in the veins of the liver. °SIGNS AND SYMPTOMS °Signs and symptoms may include: °· A feeling of fullness in your abdomen. This is common. °· An increase in the size of your abdomen or your waist. °· Swelling in your legs. °· Swelling of the scrotum in men. °· Difficulty breathing. °· Abdominal pain. °· Sudden weight gain. °If the condition is mild, you may not have symptoms. °DIAGNOSIS °To make a diagnosis, your health care provider will: °· Ask about your medical history. °· Perform a physical exam. °· Order imaging tests, such as an ultrasound or CT scan of your abdomen. °TREATMENT °Treatment depends on the cause of the ascites. It may include: °· Taking a pill to make you urinate. This is called a water pill (diuretic pill). °· Strictly reducing your salt (sodium) intake. Salt can cause extra fluid to be kept in the body, and this makes ascites worse. °· Having a procedure to remove fluid from your abdomen (paracentesis). °· Having a procedure to transfer fluid from your abdomen into a vein. °· Having a procedure that connects two of the major veins within your liver and relieves pressure on your liver (TIPS procedure). °Ascites may go away or improve with treatment of the condition that caused it.  °HOME CARE INSTRUCTIONS °· Keep track of your weight. To do this, weigh yourself at the same time every day and record your weight. °· Keep track of how much you drink and any changes in the amount you urinate. °· Follow any instructions that your health care provider gives you about how much to drink. °· Try not to eat salty (high-sodium)  foods. °· Take medicines only as directed by your health care provider. °· Keep all follow-up visits as directed by your health care provider. This is important. °· Report any changes in your health to your health care provider, especially if you develop new symptoms or your symptoms get worse. °SEEK MEDICAL CARE IF: °· Your gain more than 3 pounds in 3 days. °· Your abdominal size or your waist size increases. °· You have new swelling in your legs. °· The swelling in your legs gets worse. °SEEK IMMEDIATE MEDICAL CARE IF: °· You develop a fever. °· You develop confusion. °· You develop new or worsening difficulty breathing. °· You develop new or worsening abdominal pain. °· You develop new or worsening swelling in the scrotum (in men). °  °This information is not intended to replace advice given to you by your health care provider. Make sure you discuss any questions you have with your health care provider. °  °Document Released: 05/30/2005 Document Revised: 06/20/2014 Document Reviewed: 12/27/2013 °Elsevier Interactive Patient Education ©2016 Elsevier Inc. ° °

## 2015-05-12 NOTE — ED Provider Notes (Signed)
[facility] Emergency Department Provider Note  ____________________________________________  Time seen: 11:00 PM  I have reviewed the triage vital signs and the nursing notes.   HISTORY  Chief Complaint Abnormal Lab      HPI Kristopher Casey is a 59 y.o. male presents with history of anemia with a hemoglobin of 6.8 per [facility]. In addition to facility states that the patient has had a 10 pound weight loss since last paracentesis which was performed approximately "2 weeks ago". Patient denies any fever no nausea no vomiting. Patient afebrile on presentation emergency Department temperature 98.5. Patient does however admit to 8 out of 10 generalized and some abdominal discomfort described as pressure.     Past Medical History  Diagnosis Date  . Alcoholic cirrhosis of liver (HCC)     with ascites  . Esophageal varices without bleeding (HCC)   . COPD (chronic obstructive pulmonary disease) (HCC)   . Cellulitis of right lower extremity   . Lymphedema   . Calcific tendinitis of right shoulder   . Hypertension   . GERD without esophagitis 04/13/15  . Insomnia 04/13/15  . Generalized edema 04/13/15  . Hepatic failure, unspecified without coma (HCC)   . Hypomagnesemia   . Vitamin D deficiency   . Pain     unspecified  . Major depressive disorder (HCC)   . Obesity   . Histoplasmosis     per spouse report    Patient Active Problem List   Diagnosis Date Noted  . Ascites 05/12/2015  . COPD (chronic obstructive pulmonary disease) (HCC) 05/12/2015  . HTN (hypertension) 05/12/2015  . GERD (gastroesophageal reflux disease) 05/12/2015  . Alcoholic cirrhosis of liver [facility])     Past Surgical History  Procedure Laterality Date  . Cholecystectomy    . Tips procedure      No current outpatient prescriptions on file.  Allergies Levaquin and Protonix  History reviewed. No pertinent family history.  Social History Social History  Substance Use  Topics  . Smoking status: Never Smoker   . Smokeless tobacco: Never Used  . Alcohol Use: No    Review of Systems  Constitutional: Negative for fever. Eyes: Negative for visual changes. ENT: Negative for sore throat. Cardiovascular: Negative for chest pain. Respiratory: Negative for shortness of breath. Gastrointestinal: Positive for abdominal pain Genitourinary: Negative for dysuria. Musculoskeletal: Negative for back pain. Skin: Negative for rash. Neurological: Negative for headaches, focal weakness or numbness.   10-point ROS otherwise negative.  ____________________________________________   PHYSICAL EXAM:  VITAL SIGNS: ED Triage Vitals  Enc Vitals Group     BP 05/11/15 2301 109/69 mmHg     Pulse Rate 05/11/15 2301 98     Resp 05/11/15 2301 16     Temp 05/11/15 2301 98.5 F (36.9 C)     Temp Source 05/11/15 2301 Oral     SpO2 05/11/15 2249 93 %     Weight 05/11/15 2301 373 lb (169.192 kg)     Height 05/11/15 2301  (1.778 m)     Head Cir --      Peak Flow --      Pain Score --      Pain Loc --      Pain Edu? --      Excl. in GC? --      Constitutional: Alert and oriented. Well appearing and in no distress. Eyes: Scleral icterus, pale conjunctiva. ENT   Head: Normocephalic and atraumatic.   Nose: No congestion/rhinnorhea.  Mouth/Throat: Mucous membranes are moist.   Neck: No stridor. Hematological/Lymphatic/Immunilogical: No cervical lymphadenopathy. Cardiovascular: Normal rate, regular rhythm. Normal and symmetric distal pulses are present in all extremities. No murmurs, rubs, or gallops. Respiratory: Normal respiratory effort without tachypnea nor retractions. Breath sounds are clear and equal bilaterally. No wheezes/rales/rhonchi. Gastrointestinal: Soft and nontender. No distention. There is no CVA tenderness. Genitourinary: deferred Musculoskeletal: Nontender with normal range of motion in all extremities. No joint effusions.  No  lower extremity tenderness nor edema. Neurologic:  Normal speech and language. No gross focal neurologic deficits are appreciated. Speech is normal.  Skin:  Skin is warm, dry and intact. No rash noted. Jaundice Psychiatric: Mood and affect are normal. Speech and behavior are normal. Patient exhibits appropriate insight and judgment.  ____________________________________________    LABS (pertinent positives/negatives)  Labs Reviewed  COMPREHENSIVE METABOLIC PANEL - Abnormal; Notable for the following:    Sodium 126 (*)    Chloride 89 (*)    Glucose, Bld 185 (*)    BUN 25 (*)    Creatinine, Ser 1.33 (*)    Calcium 8.1 (*)    Total Protein 5.1 (*)    Albumin 2.3 (*)    AST 43 (*)    Total Bilirubin 3.9 (*)    GFR calc non Af Amer 57 (*)    All other components within normal limits  CBC WITH DIFFERENTIAL/PLATELET - Abnormal; Notable for the following:    RBC 2.37 (*)    Hemoglobin 7.3 (*)    HCT 20.9 (*)    RDW 18.3 (*)    Platelets 49 (*)    Lymphs Abs 0.3 (*)    All other components within normal limits  APTT - Abnormal; Notable for the following:    aPTT 43 (*)    All other components within normal limits  PROTIME-INR - Abnormal; Notable for the following:    Prothrombin Time 21.0 (*)    All other components within normal limits  AMMONIA - Abnormal; Notable for the following:    Ammonia 89 (*)    All other components within normal limits  CULTURE, BLOOD (ROUTINE X 2)  CULTURE, BLOOD (ROUTINE X 2)  URINE CULTURE  LACTIC ACID, PLASMA  LIPASE, BLOOD  TROPONIN I  LACTIC ACID, PLASMA  URINALYSIS COMPLETEWITH MICROSCOPIC ([facility] ONLY)  TYPE AND SCREEN  PREPARE RBC (CROSSMATCH)     ____________________________________________   EKG  ED ECG REPORT I, Graylyn Bunney, [place], the attending physician, personally viewed and interpreted this ECG.   Date: 05/12/2015  EKG Time: 10:56 PM  Rate: 99  Rhythm: Normal sinus rhythm  Axis: none  Intervals: Normal  ST&T Change:  None  ____________________________________________    RADIOLOGY      DG Chest Portable 1 View (Final result) Result time: 05/12/15 00:22:20   Final result by Rad Results In Interface (05/12/15 00:22:20)   Narrative:   CLINICAL DATA: Acute onset of shortness of breath and difficulty breathing. Decreased hemoglobin. Initial encounter.  EXAM: PORTABLE CHEST 1 VIEW  COMPARISON: None.  FINDINGS: The lungs are well-aerated. Vascular congestion is noted. Mildly increased interstitial markings could reflect minimal interstitial edema. There is no evidence of pleural effusion or pneumothorax.  The cardiomediastinal silhouette is mildly enlarged. No acute osseous abnormalities are seen.  IMPRESSION: Vascular congestion and mild cardiomegaly noted. Mildly increased interstitial markings could reflect minimal interstitial edema.   Electronically Signed By: Roanna RaiderJeffery Chang M.D. On: 05/12/2015 00:22  INITIAL IMPRESSION / ASSESSMENT AND PLAN / ED COURSE  Pertinent labs & imaging results that were available during my care of the patient were reviewed by me and considered in my medical decision making (see chart for details).    ____________________________________________   FINAL CLINICAL IMPRESSION(S) / ED DIAGNOSES  Final diagnoses:  Anemia, unspecified anemia type  Hyponatremia      Darci Current, MD 05/15/15 [phone]

## 2015-05-12 NOTE — Progress Notes (Signed)
Report [facility].Questions answered. Patient  Is alert and oriented. VSS. No signs of acute distress.

## 2015-05-12 NOTE — Progress Notes (Signed)
Pt states that "2mg  of morphine is not touching the pain", MD called and made aware. No new orders for pain medication given at this time.   Karsten RoLauren E Hobbs

## 2015-05-13 LAB — TYPE AND SCREEN
ABO/RH(D): O POS
Antibody Screen: NEGATIVE
Unit division: 0

## 2015-05-16 LAB — CULTURE, BLOOD (ROUTINE X 2)
Culture: NO GROWTH
Culture: NO GROWTH

## 2015-09-12 DEATH — deceased

## 2017-03-16 IMAGING — US US PARACENTESIS
1 series · 5 of 5 positions shown · non-contrast
Comparison: None.

CLINICAL DATA: Ascites.

EXAM:
ULTRASOUND GUIDED PARACENTESIS

[Series 1: us paracentesis · 0.27mm/px · 5 of 5 slices shown]
[im 1/5]
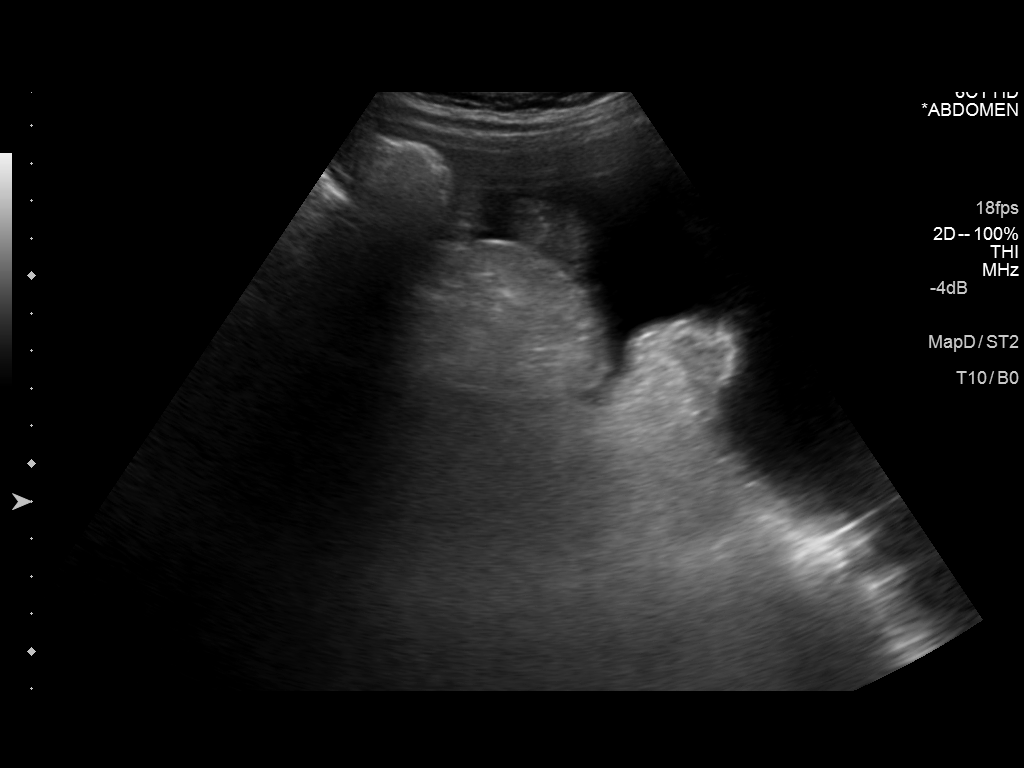
[im 2/5]
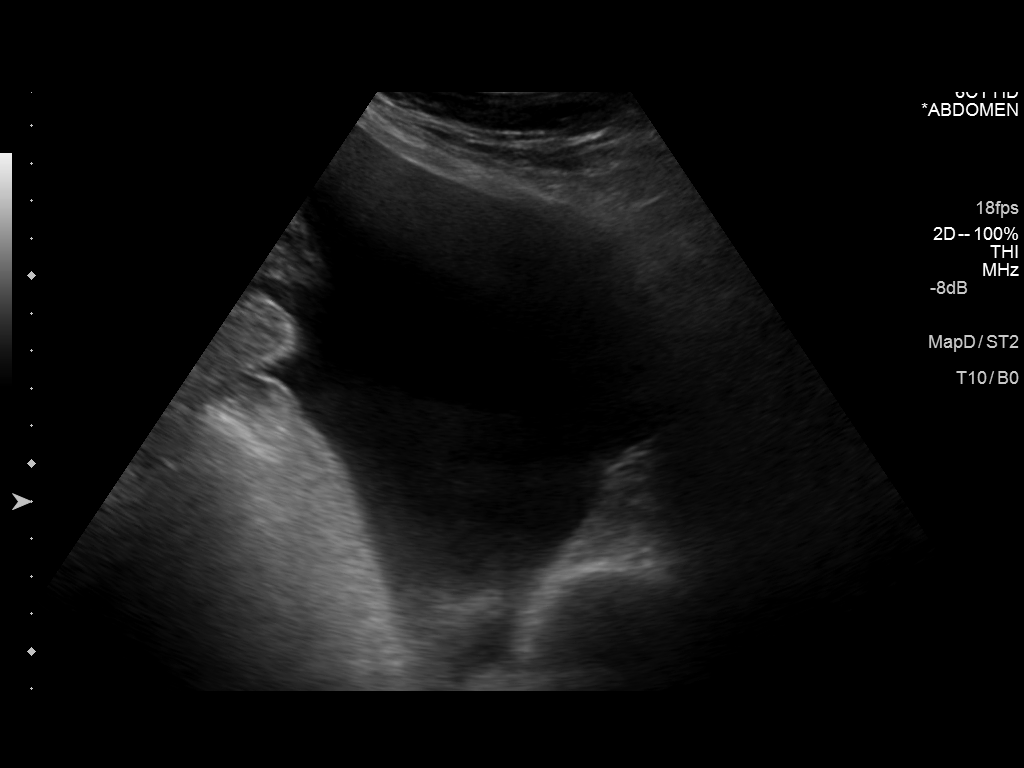
[im 3/5]
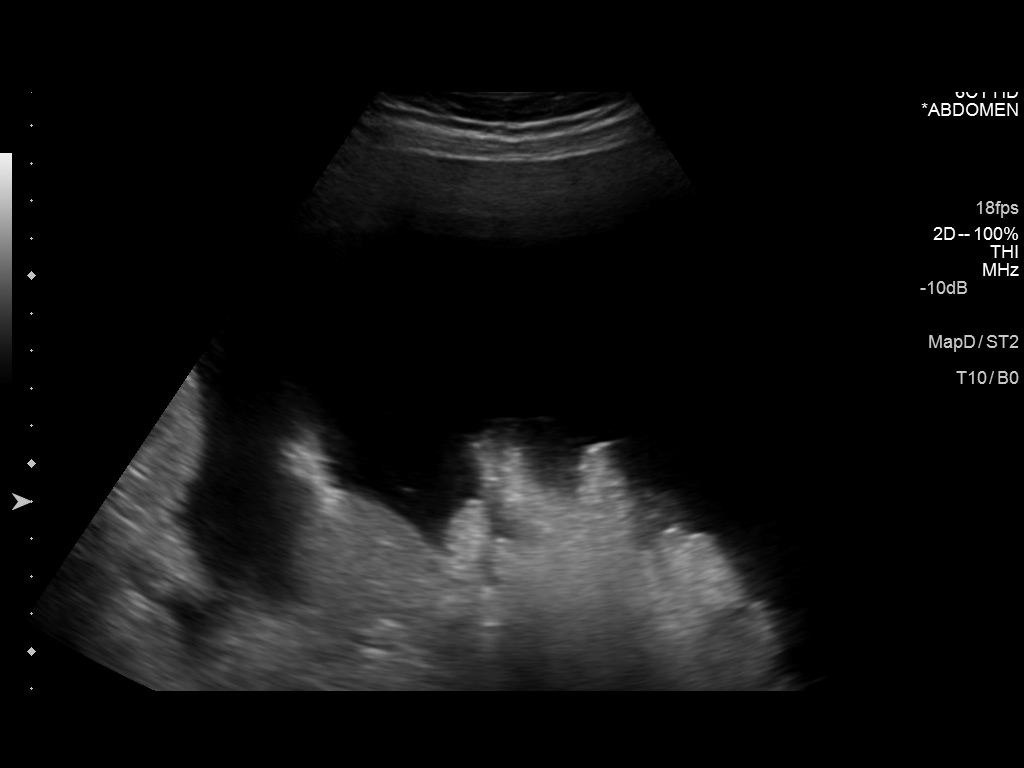
[im 4/5]
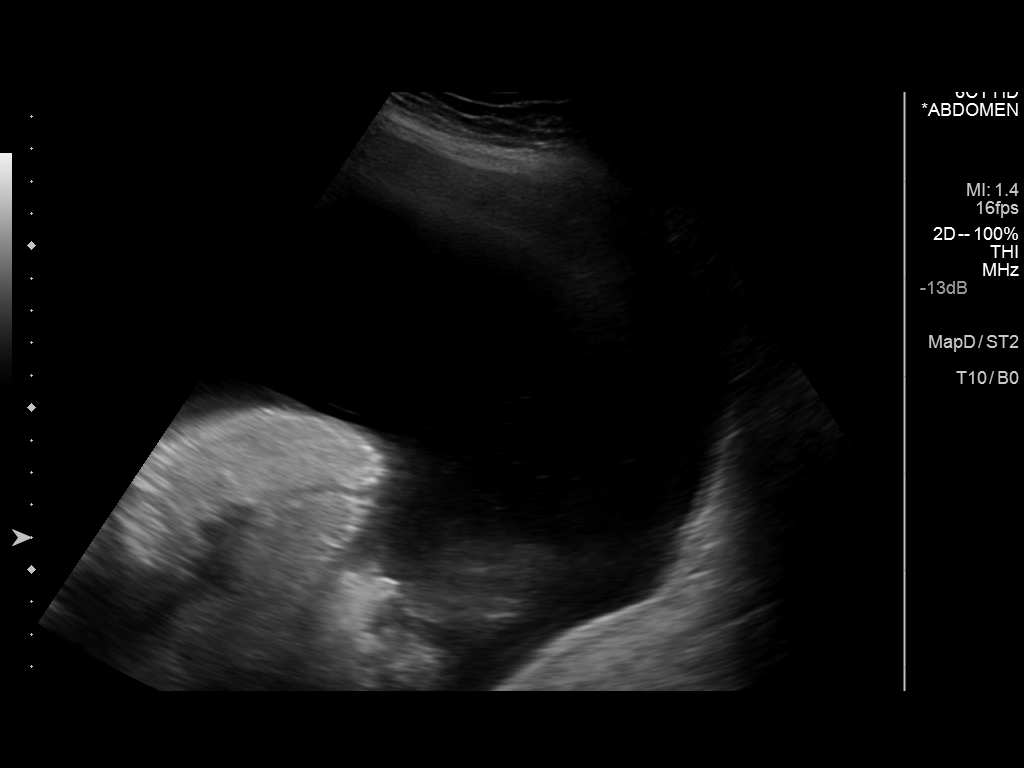
[im 5/5]
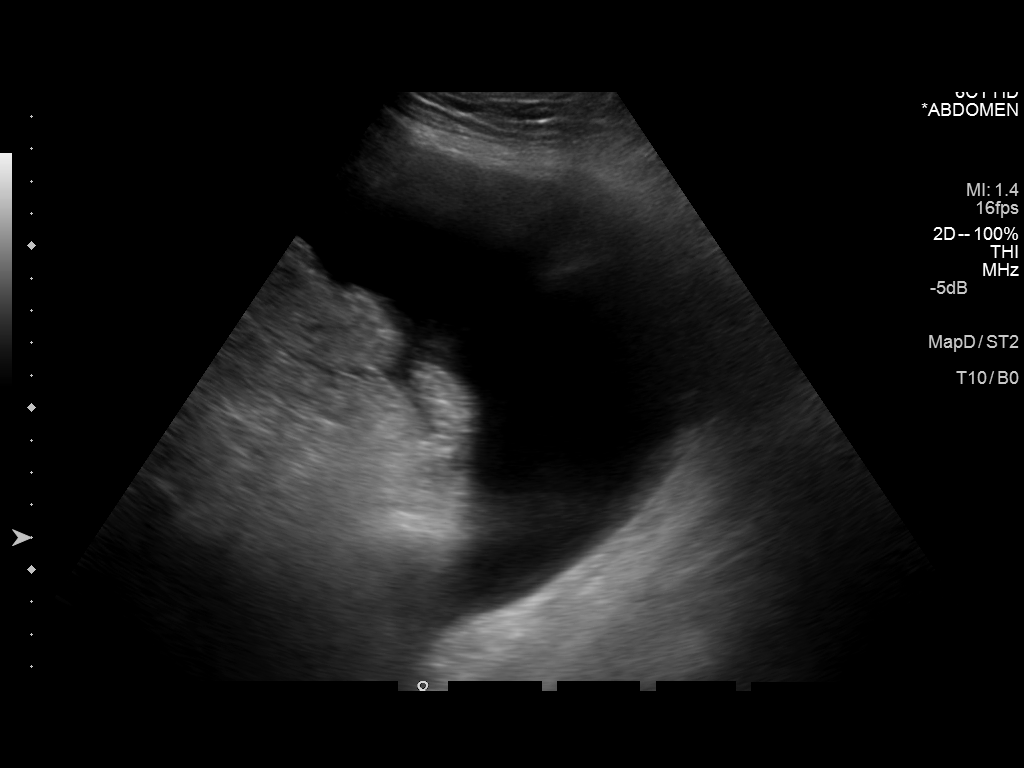

[5 of 5 positions shown; findings below may reference images not displayed]

PROCEDURE:
An ultrasound guided paracentesis was thoroughly discussed with the
patient and questions answered. The benefits, risks, alternatives
and complications were also discussed. The patient understands and
wishes to proceed with the procedure. Written consent was obtained.

Ultrasound was performed to localize and mark an adequate pocket of
fluid in the left lower quadrant of the abdomen. The area was then
prepped and draped in the normal sterile fashion. 1% Lidocaine was
used for local anesthesia. Under ultrasound guidance a
Safe-T-Centesis catheter was introduced. Paracentesis was performed.
The catheter was removed and a dressing applied.

COMPLICATIONS:
None immediate.
FINDINGS: A total of approximately 8 L of serous fluid was removed. A fluid
sample was not sent for laboratory analysis.
IMPRESSION: Successful ultrasound guided paracentesis yielding 8 L of ascites.
